# Patient Record
Sex: Male | Born: 1940 | Race: White | Hispanic: No | Marital: Married | Smoking: Never smoker
Health system: Southern US, Community
[De-identification: ages and names within clinical notes are randomized; demographics above are authoritative.]

## PROBLEM LIST (undated history)

## (undated) DIAGNOSIS — I2510 Atherosclerotic heart disease of native coronary artery without angina pectoris: Secondary | ICD-10-CM

## (undated) HISTORY — PX: CARDIAC SURGERY: SHX584

---

## 2013-01-03 LAB — COMPREHENSIVE METABOLIC PANEL
Albumin: 3.9 g/dL (ref 3.4–5.0)
Anion Gap: 8 (ref 7–16)
Bilirubin,Total: 0.5 mg/dL (ref 0.2–1.0)
Calcium, Total: 8.8 mg/dL (ref 8.5–10.1)
Chloride: 107 mmol/L (ref 98–107)
Co2: 25 mmol/L (ref 21–32)
EGFR (Non-African Amer.): >60
Glucose: 129 mg/dL — ABNORMAL HIGH (ref 65–99)
Osmolality: 283 (ref 275–301)
Potassium: 3.8 mmol/L (ref 3.5–5.1)
SGOT(AST): 21 U/L (ref 15–37)
Sodium: 140 mmol/L (ref 136–145)
Total Protein: 7.1 g/dL (ref 6.4–8.2)

## 2013-01-03 LAB — TROPONIN I: Troponin-I: 0.03 ng/mL

## 2013-01-03 LAB — CBC
HCT: 39.4 % — ABNORMAL LOW (ref 40.0–52.0)
HGB: 13.4 g/dL (ref 13.0–18.0)
MCH: 31.7 pg (ref 26.0–34.0)
MCHC: 34.1 g/dL (ref 32.0–36.0)
Platelet: 233 10*3/uL (ref 150–440)
RDW: 13 % (ref 11.5–14.5)
WBC: 7.3 10*3/uL (ref 3.8–10.6)

## 2013-01-03 LAB — CK TOTAL AND CKMB (NOT AT ARMC)
CK, Total: 64 U/L (ref 35–232)
CK-MB: 1.6 ng/mL (ref 0.5–3.6)

## 2013-01-03 LAB — MAGNESIUM: Magnesium: 1.7 mg/dL — ABNORMAL LOW

## 2013-01-04 ENCOUNTER — Inpatient Hospital Stay: Payer: Self-pay | Admitting: Internal Medicine

## 2013-01-04 LAB — CK TOTAL AND CKMB (NOT AT ARMC)
CK, Total: 76 U/L (ref 35–232)
CK, Total: 102 U/L (ref 35–232)

## 2013-01-04 LAB — TROPONIN I
Troponin-I: 0.13 ng/mL — ABNORMAL HIGH
Troponin-I: 0.08 ng/mL — ABNORMAL HIGH

## 2013-01-04 LAB — MAGNESIUM: Magnesium: 2.3 mg/dL

## 2013-01-05 LAB — BASIC METABOLIC PANEL
Anion Gap: 5 — ABNORMAL LOW (ref 7–16)
BUN: 12 mg/dL (ref 7–18)
Calcium, Total: 8.5 mg/dL (ref 8.5–10.1)
Chloride: 108 mmol/L — ABNORMAL HIGH (ref 98–107)
Co2: 27 mmol/L (ref 21–32)
EGFR (African American): >60
EGFR (Non-African Amer.): >60
Glucose: 111 mg/dL — ABNORMAL HIGH (ref 65–99)
Potassium: 4.4 mmol/L (ref 3.5–5.1)
Sodium: 140 mmol/L (ref 136–145)

## 2013-01-05 LAB — CBC WITH DIFFERENTIAL/PLATELET
HCT: 37.6 % — ABNORMAL LOW (ref 40.0–52.0)
Lymphocyte %: 13.5 %
MCH: 32.1 pg (ref 26.0–34.0)
Platelet: 220 10*3/uL (ref 150–440)
RBC: 4.05 10*6/uL — ABNORMAL LOW (ref 4.40–5.90)

## 2013-01-05 LAB — LIPID PANEL
Cholesterol: 207 mg/dL — ABNORMAL HIGH (ref 0–200)
HDL Cholesterol: 73 mg/dL — ABNORMAL HIGH (ref 40–60)
Ldl Cholesterol, Calc: 119 mg/dL — ABNORMAL HIGH (ref 0–100)
Triglycerides: 73 mg/dL (ref 0–200)
VLDL Cholesterol, Calc: 15 mg/dL (ref 5–40)

## 2014-10-11 NOTE — H&P (Signed)
PATIENT NAME:  Garrett Travis, [name] MR#:  [id number] DATE OF BIRTH:  [birth year]  DATE OF ADMISSION:  01/04/2013  REFERRING PHYSICIAN:  Dr. Bayard Malesandolph Brown   PRIMARY CARE PHYSICIAN:  Following with primary care physician at [facility] in [place].   CHIEF COMPLAINT:  Syncope.   HISTORY OF PRESENT ILLNESS:  This is a 74 year old male without significant past medical history who presents with syncope. The patient was in church where he had syncope. The patient denies any symptoms of prior to syncope.  No dizziness.  No chest pain.  No shortness of breath.  No lightheadedness. The patient had syncope for 1 minute and then he recovered consciousness without any confusion or altered mental status. There was no seizure-like activity and no urinary or stool incontinence. Denies any head trauma. When EMS presented, the patient was found to be in A-Fib with RVR, he was given IV Cardizem 10 mg IV x 2 en route without resolution of his A-Fib with RVR, so he was started on amiodarone drip in the Emergency Room Department where he converted to normal sinus. The patient's initial EKG showing A-Fib with RVR.  Repeat EKG after he converted to normal sinus rhythm did show ST inversion in the anterior lateral leads. Unfortunately, there is no old EKG to compare. The patient's basic labs were sent.  They were only significant for some mild hypomagnesemia with magnesium at 1.7. Hospitalist service was requested to admit the patient for further management and work-up for his new onset A-Fib and syncope.   PAST MEDICAL HISTORY:  None.   PAST SURGICAL HISTORY:  None.   ALLERGIES:  No known drug allergies.    HOME MEDICATIONS:  Vitamin C 500 mg oral daily.   SOCIAL HISTORY:  He is retired, used to work in [employer].  Denies any history of smoking or alcohol use.   FAMILY HISTORY:  Significant for cardiac disease, but at old age in his father.   REVIEW OF SYSTEMS:  CONSTITUTIONAL:  Denies fever, chills, fatigue,  weakness.  EYES:  Denies blurry vision, double vision, inflammation, glaucoma.  EARS, NOSE, THROAT:  Denies tinnitus, ear pain, epistaxis or discharge.  RESPIRATORY:  Denies cough, wheezing, hemoptysis, COPD.  CARDIOVASCULAR:  Denies any chest pain, orthopnea, edema, arrhythmia, palpitations.  Has had episode of syncope.  GASTROINTESTINAL:  Denies nausea, vomiting, diarrhea, abdominal pain, hematemesis, rectal bleed, jaundice.  GENITOURINARY:  Denies dysuria, hematuria, renal colic.  ENDOCRINE:  Denies polyuria, polydipsia, heat or cold intolerance.  HEMATOLOGY:  Denies anemia, easy bruising, bleeding diathesis.  INTEGUMENTARY:  Denies acne, rash or skin lesions.  MUSCULOSKELETAL:  Denies any neck pain, shoulder pain, arthritis, cramps, gout or limited activity.  NEUROLOGIC:  Denies CVA, TIA, ataxia, dementia, headache, epilepsy, weakness, tremors, vertigo.  Had episode of syncope.  PSYCHIATRIC:  Denies anxiety, schizophrenia, nervousness, bipolar disorder.   PHYSICAL EXAMINATION: VITAL SIGNS:  Temperature 97.3, pulse 78, respiratory rate 28, blood pressure 112/67, saturating 96% on room air.  GENERAL:  Well-nourished male who is comfortable in bed in no apparent distress.  HEENT:  Head atraumatic, normocephalic.  Pupils equal, reactive to light.  Pink conjunctivae.  Anicteric sclerae.  Moist oral mucosa.  NECK:  Supple.  No thyromegaly.  No JVD.  CHEST:  Good air entry bilaterally.  No wheezing, rales or rhonchi.  CARDIOVASCULAR:  S1, S2 heard.  No rubs, murmurs, gallops.  ABDOMEN:  Soft, nontender, nondistended.  Bowel sounds present.  EXTREMITIES:  No edema.  No clubbing.  No cyanosis.  Dorsalis  pedis pulse +2 bilaterally.  PSYCHIATRIC:  Appropriate affect.  Awake, alert x 3.  Intact judgment and insight.  NEUROLOGIC:  Cranial nerves grossly intact.  Motor 5 out of 5.  No focal deficits.  LYMPHATIC:  No cervical or supraclavicular lymphadenopathy.  SKIN:  Normal skin turgor.  Warm and  dry.   PERTINENT LABORATORY DATA:  Glucose 129, BUN 19, creatinine 0.98, sodium 140, potassium 3.8, chloride 107, magnesium 1.7.  Troponin 0.03, ALT 16, AST 21,  total bili 0.5, white blood cells 7.3, hemoglobin 13.4, hematocrit 39.4, platelets 233.    EKG showing normal sinus rhythm with T wave inversion in the anterior lateral leads.  Initial EKG upon presentation did show atrial fibrillation with RVR at 121 beats per minute.   ASSESSMENT AND PLAN: 1.  Syncope, this is most likely related to his new onset atrial fibrillation with rapid ventricular rate, so the patient will be admitted to telemetry unit.  We will continue to cycle his cardiac enzymes, and he will be monitored on telemetry. We will check 2-D echocardiogram in the a.m. and we will start the patient on gentle hydration.  2.  New onset atrial fibrillation, appears to be currently resolved, currently is normal sinus rhythm.  We will discontinue amiodarone drip and we will start him on low-dose metoprolol 25 mg by mouth twice daily. The patient's CHADS2 score is 1 given his age.  Denies any history of hypertension, diabetes or congestive heart failure.  We will start him on aspirin 81 mg daily.  As well, we will consult cardiology regarding further recommendation for anticoagulation if anything besides aspirin is needed, as well as to evaluate for his abnormal EKG given his T wave inversion in the anterior lateral leads.  3.  Hypomagnesemia.  We will replace.  4.  DVT prophylaxis.  SubQ heparin.  5.  CODE STATUS:  FULL CODE.   Total time spent on admission and patient care 55 minutes.    ____________________________ Starleen Arms, MD dse:ea D: 01/04/2013 01:27:05 ET T: 01/04/2013 02:19:35 ET JOB#: [id number]  cc: Starleen Arms, MD, <Dictator> Edith Groleau Teena Irani MD ELECTRONICALLY SIGNED 01/04/2013 4:59

## 2014-10-11 NOTE — Discharge Summary (Signed)
PATIENT NAME:  Garrett Travis, Garrett Travis MR#:  [id number] DATE OF BIRTH:  02/28/1941  TRANSFER SUMMARY/DISCHARGE SUMMARY  DATE OF ADMISSION:  01/04/2013 DATE OF DISCHARGE/TRANSFER:  01/05/2013  ACCEPTING FACILITY AND PHYSICIAN:  [facility], Dr. Kennon RoundsHaney.  DISCHARGE DIAGNOSIS:  Multivessel coronary artery disease, status post cardiac catheter.   SECONDARY DIAGNOSES:  1.  Syncope, likely due to underlying coronary artery disease.  2.  New onset atrial fibrillation.  3.  Hypomagnesemia, repleted and resolved.   CONSULTATION:  Cardiology, Dr. Gwen PoundsKowalski.   PROCEDURES/RADIOLOGY:  Cardiac cath by Dr. Gwen PoundsKowalski on July 18th showed critical ostial LAD and RCA stenosis, more than 95%, with severe LV dysfunction with EF of 20%. A 2-D echocardiogram on July 17th showed severe LV dysfunction with an EF less than 20%. Moderate to severely increased LV internal cavity size. Severely dilated left atrium. Moderately dilated right atrium. Moderate to severe mitral valve regurgitation. Moderate to severe tricuspid regurgitation. Moderately elevated pulmonary artery systolic pressure.   Chest x-ray on admission showed atelectasis at the right lung base.   CT scan of the head without contrast in the ED showed no acute intracranial abnormality.   Chest x-ray on July 17th showed findings consistent with low-grade CHF with small pleural effusion. No evidence of pneumonia.   Bilateral carotid Dopplers on July 17th showed no hemodynamically significant stenosis.   HISTORY AND SHORT HOSPITAL COURSE:  The patient is a 74 year old male with no significant medical problems who was admitted for syncope. Please see Dr. Randol KernElgergawy dictated history and physical for further details. The patient also had new onset a-fib. A Cardiology consultation was obtained with Dr. Gwen PoundsKowalski, who recommended 2-D echo, which was performed with the results dictated above. The EF was less than 20% for which cardiac cath was recommended,  which was done today, showing triple-vessel disease with critical stenosis of the LAD and RCA, for which the [facility] is [facility] for possible CABG.  TOTAL TIME TAKING CARE OF THIS PATIENT:  55 minutes.   ____________________________ Ellamae SiaVipul Travis. Sherryll BurgerShah, MD vss:jm D: 01/05/2013 16:55:59 ET T: 01/05/2013 17:22:29 ET JOB#: [id number]  cc: [name]. Sherryll BurgerShah, MD, <Dictator> [place] in [place], [place] Cart[place] Mogle Travis. Sherryll BurgerShah, MD, <Dictator> [place] care [place] in [place], [place] Dr. Kennon RoundsHaney, [facility] Lamar BlinksBruce J. Kowalski, MD Ellamae SiaVIPUL Travis [facility] MD ELECTRONICALLY SIGNED 01/08/2013 8:42

## 2014-10-11 NOTE — Consult Note (Signed)
PATIENT NAME:  Garrett Travis, Garrett Travis MR#:  [id number] DATE OF BIRTH:  [birth year]  DATE OF CONSULTATION:  01/04/2013  REFERRING PHYSICIAN: Huey Bienenstockawood Elgergawy, M.D.   CONSULTING PHYSICIAN:  Lamar BlinksBruce J. Geanie Pacifico, MD  REASON FOR CONSULTATION: Syncope with atrial fibrillation.   CHIEF COMPLAINT: "I passed out."   HISTORY OF PRESENT ILLNESS: This is a 74 year old male with no evidence of cardiovascular history, who has had a new onset of episodes of syncope while at church. He had no warning at that time and had a full syncopal episode for which he had no symptoms thereafter. He has not had any congestive heart failure type symptoms, weakness, fatigue, or chest discomfort. He has had an elevated troponin of 0.13 consistent with demand ischemia and not consistent with acute subendocardial myocardial infarction. The patient has had a telemetry showing atrial fibrillation with rapid ventricular rate, now in normal sinus rhythm with spontaneous conversion to normal sinus rhythm. The patient was placed on metoprolol and is remaining in normal sinus rhythm at this time. Currently, he has no symptoms. Carotid Doppler shows normal carotid arteries with no evidence of significant carotid atherosclerosis.   PAST MEDICAL HISTORY: None.   FAMILY HISTORY: No family members with early onset of cardiovascular disease or hypertension.   SOCIAL HISTORY: Currently denies alcohol or tobacco use.   ALLERGIES: As listed.   MEDICATIONS: As listed.   PHYSICAL EXAMINATION:  VITAL SIGNS: Blood pressure is 115/76 bilaterally, heart rate 72 upright, reclining, and regular.  GENERAL: He is a well appearing male in no acute distress.  HEENT: No icterus, thyromegaly, ulcers, hemorrhage or xanthelasma.  CARDIOVASCULAR: Irregularly irregular with a normal S1 and S2 with a 2 out of 6 apical murmur consistent with mitral regurgitation. PMI is diffuse. Carotid upstroke normal without bruit. Jugular venous pressure is normal.  LUNGS: Few  basilar crackles with normal respirations.  ABDOMEN: Soft, nontender, without hepatosplenomegaly or masses. Abdominal aorta is normal size without bruit.  EXTREMITIES: 2+ bilateral pulses in dorsal, pedal, radial, and femoral arteries without lower extremity edema, cyanosis, clubbing, or ulcers.  NEUROLOGIC: He is oriented to time, place, and person with normal mood and affect.   ASSESSMENT: This is a 74 year old male with minimal elevation of troponin most consistent with demand ischemia with an episode of syncope and atrial fibrillation, now in normal sinus rhythm on beta blocker needing further evaluation and treatment options.   RECOMMENDATIONS:  1.  Continue ambulating following for beta blocker, heart rate control and maintenance of normal rhythm.  2.  Echocardiogram for LV systolic dysfunction, valvular heart disease causing above.  3.  Carotid Doppler to assess for carotid atherosclerosis causing current symptoms.  4.  Further diagnostic testing and treatment options after above.  ____________________________ Lamar BlinksBruce J. Koren Plyler, MD bjk:aw D: 01/04/2013 12:42:39 ET T: 01/04/2013 13:28:27 ET JOB#: [id number]  cc: Lamar BlinksBruce J. Zelie Asbill, MD, <Dictator> Lamar BlinksBRUCE J Natha Guin MD ELECTRONICALLY SIGNED 01/06/2013 10:33

## 2014-12-07 IMAGING — US US CAROTID DUPLEX BILAT
1 series · 14 of 24 positions shown · non-contrast
Comparison: none

REASON FOR EXAM: syncope
COMMENTS:

[Series 1: us carotid duplex bilat · 0.10mm/px · 14 of 71 slices shown]
[im 1/71]
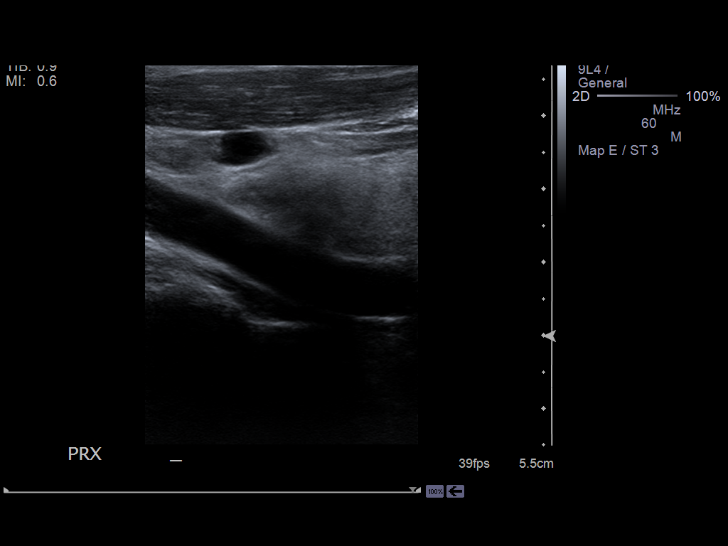
[im 7/71]
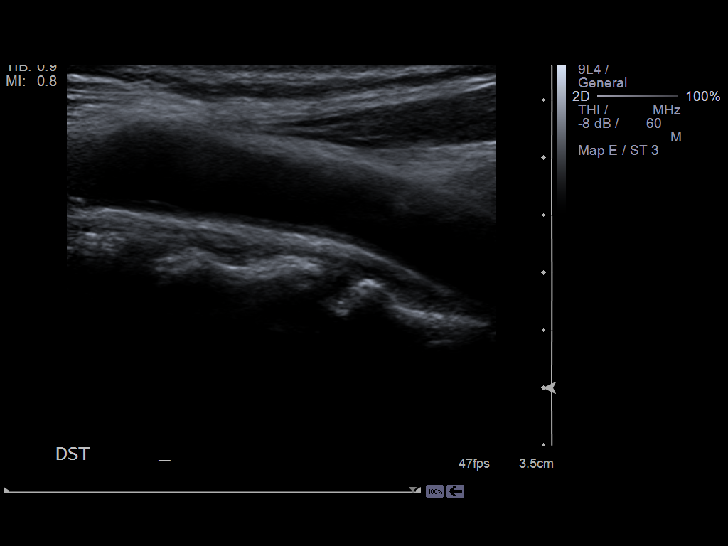
[im 13/71]
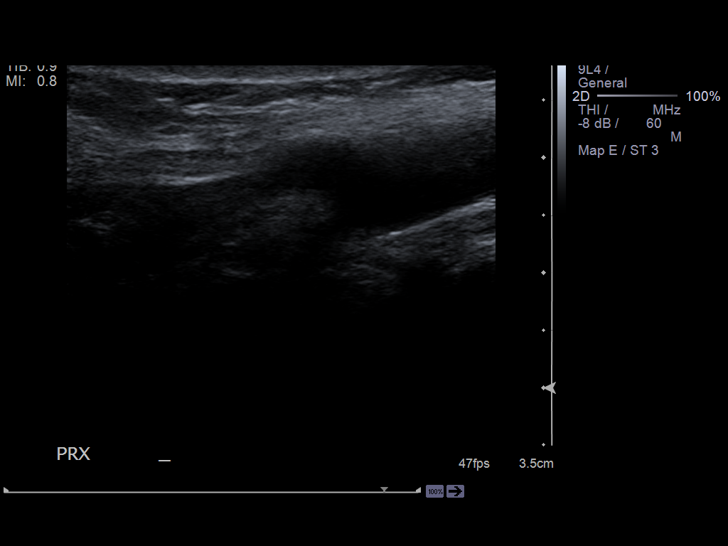
[im 19/71]
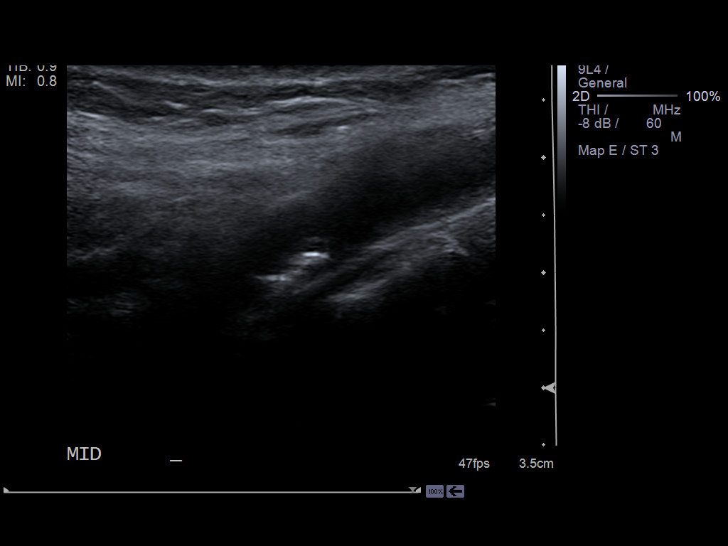
[im 22/71]
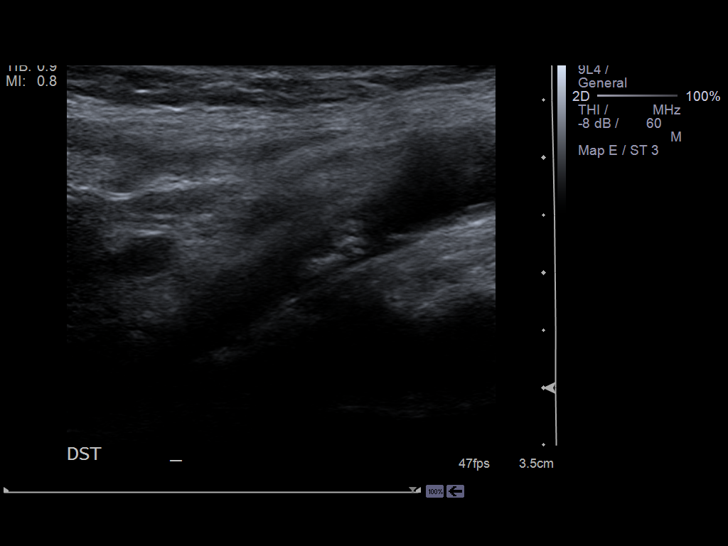
[im 28/71]
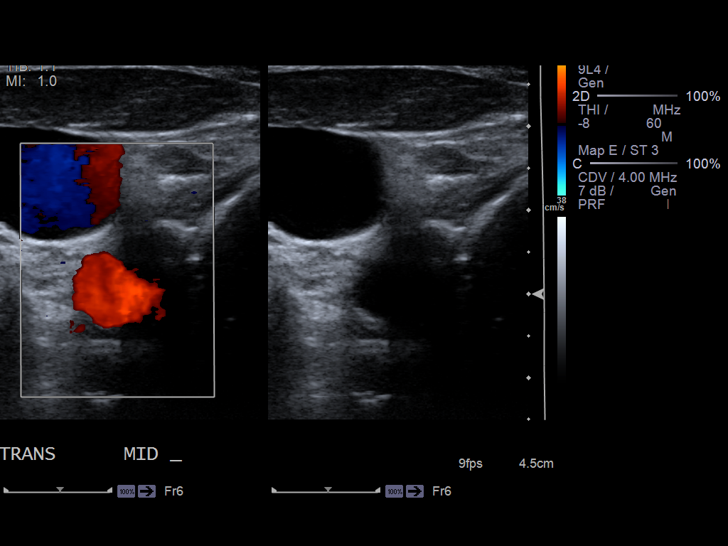
[im 34/71]
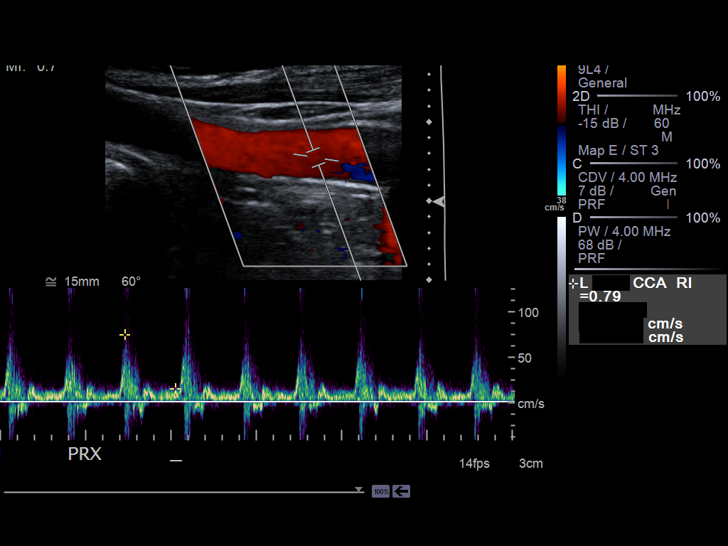
[im 37/71]
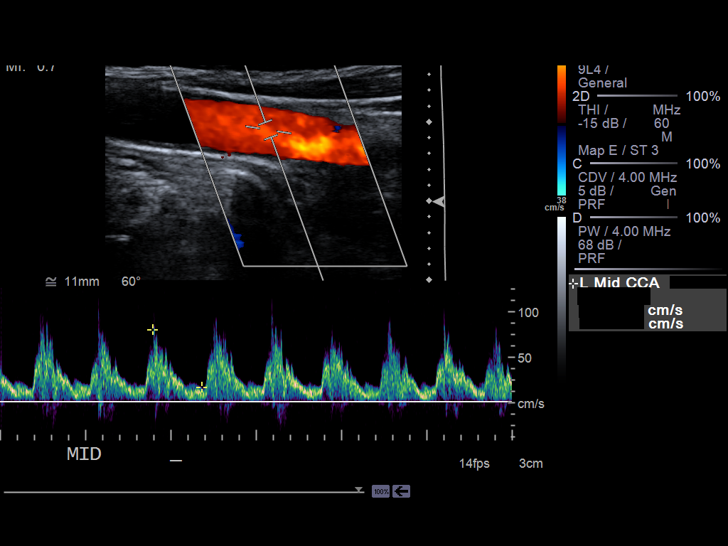
[im 43/71]
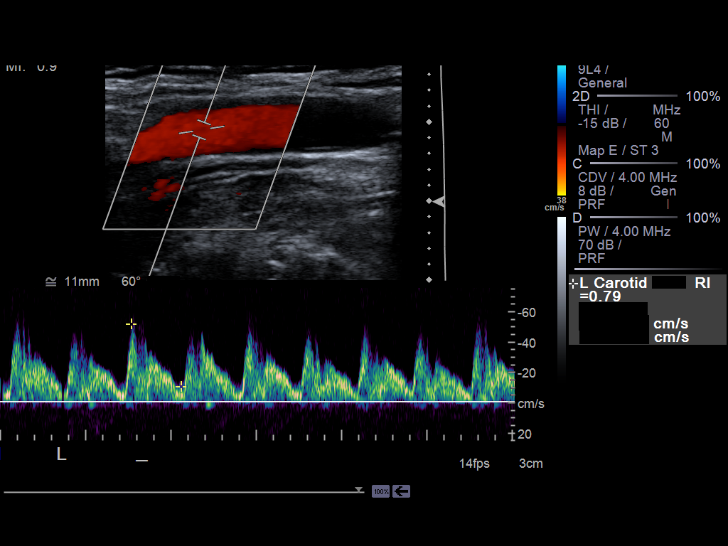
[im 49/71]
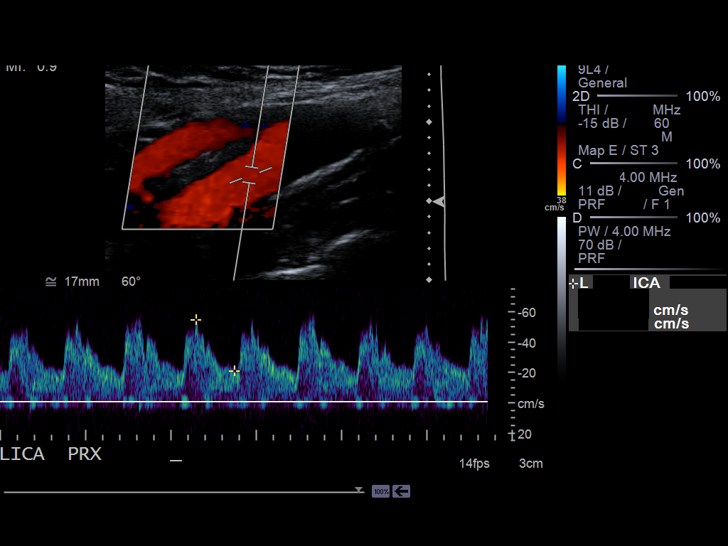
[im 55/71]
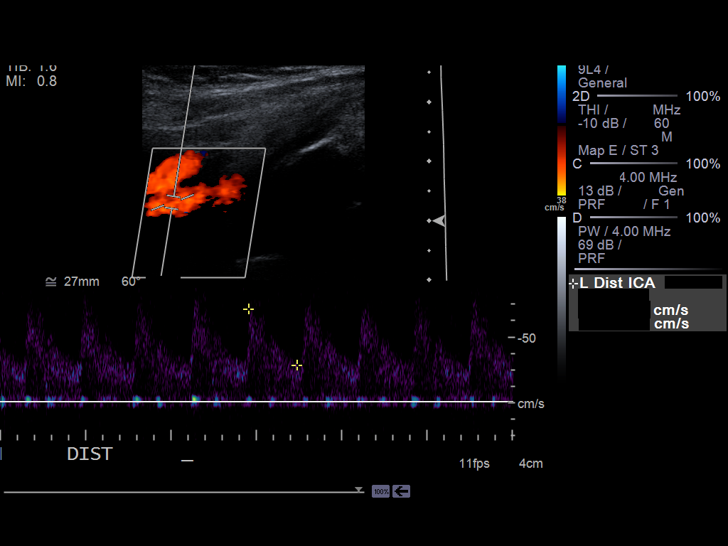
[im 58/71]
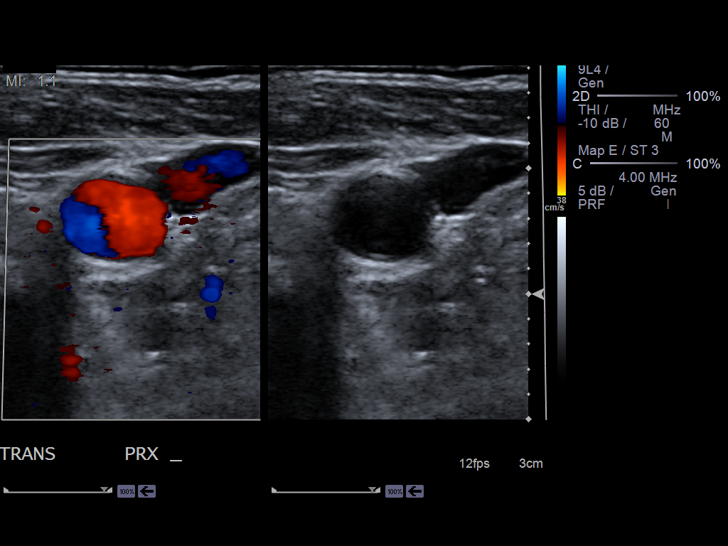
[im 64/71]
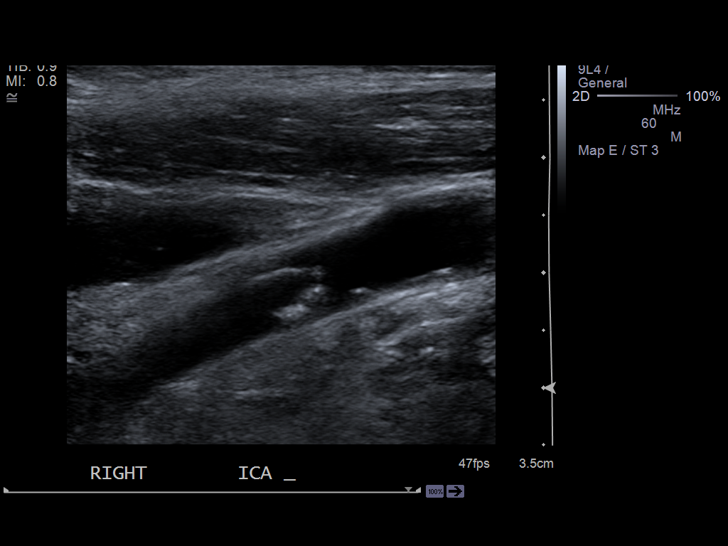
[im 71/71]
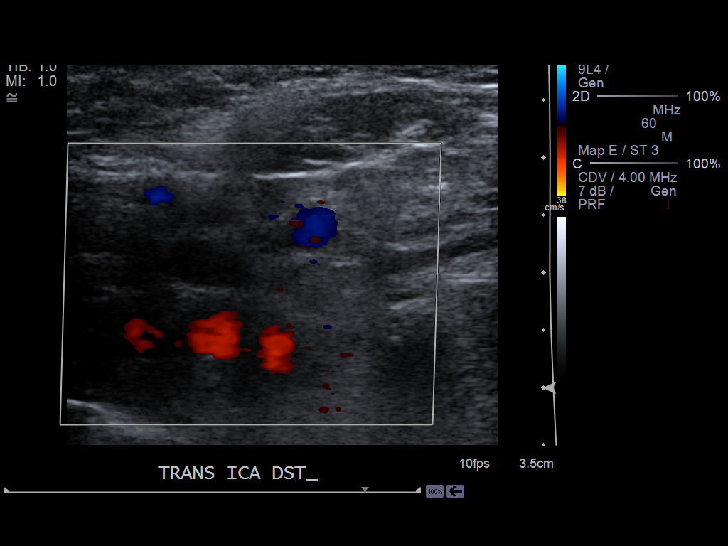

[14 of 24 positions shown; findings below may reference images not displayed]

PROCEDURE:     US  - US CAROTID DOPPLER BILATERAL  - January 04, 2013  [DATE]

RESULT:     Grayscale and color flow Doppler techniques were employed to
evaluate the cervical carotid arteries.

On the right there is a small amount of calcified plaque in the proximal ICA
with a small amount of soft plaque at the bulb. The waveform patterns and
color flow images do not suggest significant stenotic lesions. On the right
the peak internal carotid systolic velocity measured 95 cm/sec and the peak
common carotid velocity measured 84 cm/sec corresponding to a normal ratio
of 1.1.

On the left there is a small amount of calcified plaque in the distal CCA
and a small amount of soft plaque at the carotid bulb. No plaque is
demonstrated in the proximal ICA. The peak internal carotid systolic
velocity measured 71 cm/sec and the peak common carotid velocity measured 80
cm/sec corresponding to a normal ratio of 0.9.

The vertebral arteries are normal in flow direction bilaterally.
IMPRESSION: There is no evidence of a hemodynamically significant
carotid stenosis.

[REDACTED]

## 2018-10-18 ENCOUNTER — Encounter: Payer: Self-pay | Admitting: Emergency Medicine

## 2018-10-18 ENCOUNTER — Other Ambulatory Visit: Payer: Self-pay

## 2018-10-18 ENCOUNTER — Emergency Department
Admission: EM | Admit: 2018-10-18 | Discharge: 2018-10-18 | Disposition: A | Discharge status: Other Acute Inpatient Hospital | Payer: Medicare PPO | Attending: Emergency Medicine | Admitting: Emergency Medicine

## 2018-10-18 ENCOUNTER — Emergency Department: Payer: Medicare PPO

## 2018-10-18 DIAGNOSIS — Z79899 Other long term (current) drug therapy: Secondary | ICD-10-CM | POA: Diagnosis not present

## 2018-10-18 DIAGNOSIS — Y929 Unspecified place or not applicable: Secondary | ICD-10-CM | POA: Diagnosis not present

## 2018-10-18 DIAGNOSIS — Z7901 Long term (current) use of anticoagulants: Secondary | ICD-10-CM | POA: Insufficient documentation

## 2018-10-18 DIAGNOSIS — S61224A Laceration with foreign body of right ring finger without damage to nail, initial encounter: Secondary | ICD-10-CM | POA: Diagnosis not present

## 2018-10-18 DIAGNOSIS — S62652B Nondisplaced fracture of medial phalanx of right middle finger, initial encounter for open fracture: Secondary | ICD-10-CM

## 2018-10-18 DIAGNOSIS — I2510 Atherosclerotic heart disease of native coronary artery without angina pectoris: Secondary | ICD-10-CM | POA: Insufficient documentation

## 2018-10-18 DIAGNOSIS — Y998 Other external cause status: Secondary | ICD-10-CM | POA: Insufficient documentation

## 2018-10-18 DIAGNOSIS — W312XXA Contact with powered woodworking and forming machines, initial encounter: Secondary | ICD-10-CM | POA: Insufficient documentation

## 2018-10-18 DIAGNOSIS — S62652A Nondisplaced fracture of medial phalanx of right middle finger, initial encounter for closed fracture: Secondary | ICD-10-CM | POA: Diagnosis not present

## 2018-10-18 DIAGNOSIS — S61214A Laceration without foreign body of right ring finger without damage to nail, initial encounter: Secondary | ICD-10-CM

## 2018-10-18 DIAGNOSIS — Y999 Unspecified external cause status: Secondary | ICD-10-CM | POA: Insufficient documentation

## 2018-10-18 DIAGNOSIS — S61210A Laceration without foreign body of right index finger without damage to nail, initial encounter: Secondary | ICD-10-CM | POA: Insufficient documentation

## 2018-10-18 DIAGNOSIS — S68119A Complete traumatic metacarpophalangeal amputation of unspecified finger, initial encounter: Secondary | ICD-10-CM

## 2018-10-18 DIAGNOSIS — S68129A Partial traumatic metacarpophalangeal amputation of unspecified finger, initial encounter: Secondary | ICD-10-CM

## 2018-10-18 DIAGNOSIS — S6991XA Unspecified injury of right wrist, hand and finger(s), initial encounter: Secondary | ICD-10-CM | POA: Diagnosis present

## 2018-10-18 DIAGNOSIS — Y939 Activity, unspecified: Secondary | ICD-10-CM | POA: Insufficient documentation

## 2018-10-18 HISTORY — DX: Atherosclerotic heart disease of native coronary artery without angina pectoris: I25.10

## 2018-10-18 LAB — CBC WITH DIFFERENTIAL/PLATELET
Abs Immature Granulocytes: 0.02 10*3/uL (ref 0.00–0.07)
Basophils Absolute: 0.1 10*3/uL (ref 0.0–0.1)
Basophils Relative: 1 %
Eosinophils Absolute: 0.3 10*3/uL (ref 0.0–0.5)
Eosinophils Relative: 5 %
HCT: 39.4 % (ref 39.0–52.0)
Hemoglobin: 12.6 g/dL — ABNORMAL LOW (ref 13.0–17.0)
Immature Granulocytes: 0 %
Lymphocytes Relative: 17 %
Lymphs Abs: 1.1 10*3/uL (ref 0.7–4.0)
MCH: 32.7 pg (ref 26.0–34.0)
MCHC: 32 g/dL (ref 30.0–36.0)
MCV: 102.3 fL — ABNORMAL HIGH (ref 80.0–100.0)
Monocytes Absolute: 0.8 10*3/uL (ref 0.1–1.0)
Monocytes Relative: 13 %
Neutro Abs: 4.1 10*3/uL (ref 1.7–7.7)
Neutrophils Relative %: 64 %
Platelets: 267 10*3/uL (ref 150–400)
RBC: 3.85 MIL/uL — ABNORMAL LOW (ref 4.22–5.81)
RDW: 12.9 % (ref 11.5–15.5)
WBC: 6.5 10*3/uL (ref 4.0–10.5)
nRBC: 0 % (ref 0.0–0.2)

## 2018-10-18 LAB — COMPREHENSIVE METABOLIC PANEL
ALT: 19 U/L (ref 0–44)
AST: 26 U/L (ref 15–41)
Albumin: 4.2 g/dL (ref 3.5–5.0)
Alkaline Phosphatase: 91 U/L (ref 38–126)
Anion gap: 11 (ref 5–15)
BUN: 21 mg/dL (ref 8–23)
CO2: 23 mmol/L (ref 22–32)
Calcium: 8.9 mg/dL (ref 8.9–10.3)
Chloride: 105 mmol/L (ref 98–111)
Creatinine, Ser: 1.04 mg/dL (ref 0.61–1.24)
GFR calc Af Amer: >60 mL/min (ref >60)
GFR calc non Af Amer: >60 mL/min (ref >60)
Glucose, Bld: 122 mg/dL — ABNORMAL HIGH (ref 70–99)
Potassium: 4.3 mmol/L (ref 3.5–5.1)
Sodium: 139 mmol/L (ref 135–145)
Total Bilirubin: 1.1 mg/dL (ref 0.3–1.2)
Total Protein: 7.3 g/dL (ref 6.5–8.1)

## 2018-10-18 LAB — PROTIME-INR
INR: 1.5 — ABNORMAL HIGH (ref 0.8–1.2)
Prothrombin Time: 17.9 seconds — ABNORMAL HIGH (ref 11.4–15.2)

## 2018-10-18 MED ORDER — TRANEXAMIC ACID 1000 MG/10ML IV SOLN: 1000.0000 mg | Freq: Once | INTRAVENOUS | Status: DC

## 2018-10-18 MED ORDER — SODIUM CHLORIDE 0.9 % IV BOLUS
1000.0000 mL | Freq: Once | INTRAVENOUS | Status: AC
  Administered 2018-10-18: 1000 mL via INTRAVENOUS

## 2018-10-18 MED ORDER — TRANEXAMIC ACID-NACL 1000-0.7 MG/100ML-% IV SOLN
1000.0000 mg | INTRAVENOUS | Status: AC
  Administered 2018-10-18: 1000 mg via INTRAVENOUS
  Filled 2018-10-18: qty 100

## 2018-10-18 MED ORDER — TRANEXAMIC ACID 1000 MG/10ML IV SOLN
1000.0000 mg | Freq: Once | INTRAVENOUS | Status: AC
  Administered 2018-10-18: 1000 mg via TOPICAL
  Filled 2018-10-18: qty 10

## 2018-10-18 MED ORDER — FENTANYL CITRATE (PF) 100 MCG/2ML IJ SOLN
100.0000 ug | Freq: Once | INTRAMUSCULAR | Status: AC
  Administered 2018-10-18: 100 ug via INTRAVENOUS
  Filled 2018-10-18: qty 2

## 2018-10-18 MED ORDER — ONDANSETRON HCL 4 MG/2ML IJ SOLN
4.0000 mg | Freq: Once | INTRAMUSCULAR | Status: AC
  Administered 2018-10-18: 16:00:00 4 mg via INTRAVENOUS
  Filled 2018-10-18: qty 2

## 2018-10-18 NOTE — ED Notes (Signed)
Pt cut right hand with a table saw today.  Bleeding controlled.  Pt has lacerations to right 2nd, 3rd, 4th and 5th fingers.  Pt has good distal pulses.  Iv started and labs sent.  Pa-c in with pt on arrival to treatment room .  Pt alert

## 2018-10-18 NOTE — ED Notes (Signed)
EMTALA reviewed. 

## 2018-10-18 NOTE — ED Notes (Signed)
Family, pa-c in with pt  Pt waiting on a transfer to [facility].   Pt alert. Bleeding controlled.  Iv infusing.

## 2018-10-18 NOTE — ED Notes (Signed)
txa given by cuthriell pa-c.  Iv fluids infusing.  Pt alert.

## 2018-10-18 NOTE — ED Notes (Addendum)
[facility] ground crew in with pt now.  Pt alert.  Bleeding controlled on right hand.  Iv fluids infusing.

## 2018-10-18 NOTE — ED Notes (Signed)
Transfer consent signed by wife.

## 2018-10-18 NOTE — ED Provider Notes (Signed)
[facility] Emergency Department Provider Note  ____________________________________________  Time seen: Approximately 3:25 PM  I have reviewed the triage vital signs and the nursing notes.   HISTORY  Chief Complaint Laceration    HPI Garrett S Eda Keyserry Jr. is a 78 y.o. male who presents the emergency department for hand laceration to the right hand.  Patient was using a table saw, attempting to cut a piece of wood when his wife said something to him.  Patient reports that he was distracted, looked up and ran his hand into the table saw.  Patient has no range of motion to the third digit of the right hand and has lacerations to the index, middle, ring and little finger.  Patient is on blood thinners and has not been able to stop the bleeding.  Patient denies any other injury or complaint.  Patient's tetanus shot is up-to-date at this time.           Past Medical History:  Diagnosis Date   Coronary artery disease     There are no active problems to display for this patient.   Past Surgical History:  Procedure Laterality Date   CARDIAC SURGERY      Prior to Admission medications   Medication Sig Start Date End Date Taking? Authorizing Provider  atorvastatin (LIPITOR) 20 MG tablet Take 20 mg by mouth daily.   Yes [provider]  metoprolol succinate (TOPROL-XL) 25 MG 24 hr tablet Take 25 mg by mouth 2 (two) times daily.   Yes [provider]  docusate sodium (COLACE) 100 MG capsule Take 100 mg by mouth 2 (two) times daily.    [provider]    Allergies Patient has no known allergies.  No family history on file.  Social History Social History   Tobacco Use   Smoking status: Never Smoker   Smokeless tobacco: Never Used  Substance Use Topics   Alcohol use: Not Currently   Drug use: Not on file     Review of Systems  Constitutional: No fever/chills Eyes: No visual changes. No discharge ENT: No upper  respiratory complaints. Cardiovascular: no chest pain. Respiratory: no cough. No SOB. Gastrointestinal: No abdominal pain.  No nausea, no vomiting.  Musculoskeletal: Tablesaw injury to the right hand Skin: Negative for rash, abrasions, lacerations, ecchymosis. Neurological: Negative for headaches, focal weakness or numbness. 10-point ROS otherwise negative.  ____________________________________________   PHYSICAL EXAM:  VITAL SIGNS: ED Triage Vitals  Enc Vitals Group     BP 10/18/18 1505 (!) 144/80     Pulse Rate 10/18/18 1505 83     Resp --      Temp 10/18/18 1505 (!) 97.5 F (36.4 C)     Temp Source 10/18/18 1505 Oral     SpO2 10/18/18 1505 94 %     Weight --      Height --      Head Circumference --      Peak Flow --      Pain Score 10/18/18 1502 9     Pain Loc --      Pain Edu? --      Excl. in GC? --      Constitutional: Alert and oriented. Well appearing and in no acute distress. Eyes: Conjunctivae are normal. PERRL. EOMI. Head: Atraumatic. Neck: No stridor.    Cardiovascular: Normal rate, regular rhythm. Normal S1 and S2.  Good peripheral circulation. Respiratory: Normal respiratory effort without tachypnea or retractions. Lungs CTAB. Good air entry to  the bases with no decreased or absent breath sounds. Musculoskeletal: Full range of motion to all extremities. No gross deformities appreciated.  Visualization of the right hand reveals multiple ragged lacerations.  Patient has lacerations to the index, middle, ring and little digit.  Patient has heavy bleeding from multiple lacerations with most significant bleeding occurring from the third digit.  This is pulsatile in nature.  Distal finger of the middle finger is blue in color.  Patient has no extension or flexion of this digit at this time.  Patient has no sensation to this digit either.  Patient does have sensation and range of motion to the second, fourth and fifth digits.  Capillary refill intact in these digits.   See below pictures for laceration placements. Neurologic:  Normal speech and language. No gross focal neurologic deficits are appreciated.  Skin:  Skin is warm, dry and intact. No rash noted. Psychiatric: Mood and affect are normal. Speech and behavior are normal. Patient exhibits appropriate insight and judgement.       ____________________________________________   LABS (all labs ordered are listed, but only abnormal results are displayed)  Labs Reviewed  COMPREHENSIVE METABOLIC PANEL - Abnormal; Notable for the following components:      Result Value   Glucose, Bld 122 (*)    All other components within normal limits  CBC WITH DIFFERENTIAL/PLATELET - Abnormal; Notable for the following components:   RBC 3.85 (*)    Hemoglobin 12.6 (*)    MCV 102.3 (*)    All other components within normal limits  PROTIME-INR - Abnormal; Notable for the following components:   Prothrombin Time 17.9 (*)    INR 1.5 (*)    All other components within normal limits  TYPE AND SCREEN   ____________________________________________  EKG   ____________________________________________  RADIOLOGY I personally viewed and evaluated these images as part of my medical decision making, as well as reviewing the written report by the radiologist.  On x-ray, gross soft tissue injury identified to the index, middle, ring and pinky fingers.  Patient has significant bony loss over the middle phalanx of the middle finger.  Patient has amputated through the distal tuft of the pinky finger.  Dg Hand Complete Right  Result Date: 10/18/2018 CLINICAL DATA:  78 year old male with table saw injury EXAM: RIGHT HAND - COMPLETE 3+ VIEW COMPARISON:  None. FINDINGS: Soft tissue defect and acute fracture/bony defect of the middle phalanx of the third digit of the right hand. The fracture line/defect extends to the PIP. Displaced bony fragment within the dorsal soft tissues of the third digit. Acute soft tissue defect and  bony abnormality of the distal phalanx of the fifth digit with displacement of the distal fracture fragment/defect. Radiopaque foreign bodies within the soft tissues on the dorsal aspect of the 4/5 metacarpal heads. Degenerative changes of the interphalangeal joints. Degenerative changes of the first interphalangeal joint and carpometacarpal joint. IMPRESSION: Acute penetrating injury with soft tissue defect and acute bony defect involving the base of the middle phalanx of the third digit, as well as the distal phalanx of the fifth digit, with displacement of bone fragments. Electronically Signed   By: Gilmer Mor D.O.   On: 10/18/2018 15:49    ____________________________________________    PROCEDURES  Procedure(s) performed:    .Critical Care Performed by: Racheal Patches, PA-C Authorized by: Racheal Patches, PA-C   Critical care provider statement:    Critical care time (minutes):  45   Critical care time was exclusive  of:  Separately billable procedures and treating other patients and teaching time   Critical care was necessary to treat or prevent imminent or life-threatening deterioration of the following conditions:  Trauma   Critical care was time spent personally by me on the following activities:  Development of treatment plan with patient or surrogate, discussions with consultants, evaluation of patient's response to treatment, examination of patient, ordering and performing treatments and interventions, ordering and review of laboratory studies and ordering and review of radiographic studies   I assumed direction of critical care for this patient from another provider in my specialty: no   Comments:     Patient sustained significant injury to the right hand from a table saw.  Patient had arterial bleeding, near amputation of 2 digits.      Medications  tranexamic acid (CYKLOKAPRON) injection 1,000 mg (1,000 mg Topical Given 10/18/18 1555)  sodium chloride 0.9 %  bolus 1,000 mL (0 mLs Intravenous Stopped 10/18/18 1650)  fentaNYL (SUBLIMAZE) injection 100 mcg (100 mcg Intravenous Given 10/18/18 1620)  ondansetron (ZOFRAN) injection 4 mg (4 mg Intravenous Given 10/18/18 1620)     ____________________________________________   INITIAL IMPRESSION / ASSESSMENT AND PLAN / ED COURSE  Pertinent labs & imaging results that were available during my care of the patient were reviewed by me and considered in my medical decision making (see chart for details).  Review of the [place] CSRS was performed in accordance of the [facility] prior to dispensing any controlled drugs.  Clinical Course as of Oct 17 1704  Wed Oct 18, 2018  1607 Discussed the case with on-call orthopedic surgeon, Dr. Allena Katz.  After discussion, Allena Katz advises that they are unable to manage patient and that he will require transfer.  [facility] is contacted at this time for transfer.   [JC]  1608 Patient presented to the emergency department with significant lacerations to the index, middle, fourth and ring finger.  On exam, and.  That patient had nearly amputated both the distal aspect of the fifth finger as well as the third.  Patient's third finger was dusky, cool to the touch, no range of motion, significantly delayed capillary refill.  X-ray reveals almost complete amputation through the middle phalanx of the third finger as well as amputation of the distal tuft of the fifth.  Patient has had significant bleeding from the third digit.  This has been arterial in nature with spurring blood.  Initially, restricting band at the wrist seem to improve bleeding with direct pressure, however patient developed worsening bleeding.  TXA is applied topically as well as a finger tourniquet applied at this time.  This has drastically reduced patient's arterial bleeding.   [JC]    Clinical Course User Index [JC] Jenilyn Magana, Delorise Royals, PA-C          Patient's diagnosis is consistent with an injury with lacerations to the  second, third, fourth, fifth digits.  Patient presented to the emergency department after injury from a table saw.  Patient had significant injuries to the third and fifth digit as well as deep lacerations to the second and fourth.  Patient did have arterial bleeding to the third finger as well.  X-ray reveals amputation of the distal tuft of the fifth finger as well as near amputation through the middle phalanx of the third digit.  Patient initially had significant bleeding primarily from the middle finger.  Initial constricting band and direct pressure seemed to improve bleeding somewhat, however patient then developed further arterial, pulsatile sprain  bleeding.  At that time, TXA was applied and turnicot applied to the digit.  This has controlled bleeding to a slow ooze.  Discussed the case with on-call orthopedic surgeon who advises that patient will need transfer for hand surgery.  [facility] is contacted for transfer to the trauma service.  Trauma hand surgeon, Dr.Ruch accepts the patient in transfer.  Patient is stable at this time with bleeding controlled and vital signs stable.  Patient will be transferred via [facility] critical care ambulance service.      ____________________________________________  FINAL CLINICAL IMPRESSION(S) / ED DIAGNOSES  Final diagnoses:  Laceration of right index finger without foreign body without damage to nail, initial encounter  Open nondisplaced fracture of middle phalanx of right middle finger, initial encounter  Laceration of right ring finger without foreign body without damage to nail, initial encounter  Amputation of tip of finger, initial encounter      NEW MEDICATIONS STARTED DURING THIS VISIT:  ED Discharge Orders    None          This chart was dictated using voice recognition software/Dragon. Despite best efforts to proofread, errors can occur which can change the meaning. Any change was purely unintentional.    Racheal Patches,  PA-C 10/18/18 1706    Phineas Semen, MD 10/18/18 [phone]

## 2018-10-18 NOTE — ED Notes (Signed)
Continue to wait on [facility] transfer.

## 2018-10-18 NOTE — ED Triage Notes (Signed)
Says he was using table saw and accidentally cut fingers on right hand.  Bleeding is controlled right now.  Multiple jagged lacerations.

## 2018-10-18 NOTE — ED Notes (Signed)
Dr Derrill Kay in with pt and family.  Pt alert  Iv in place

## 2018-10-18 NOTE — ED Notes (Signed)
Iv pain meds given.  Pt alert.

## 2018-10-18 NOTE — ED Notes (Signed)
White stickers applied to all labs.  Spoke with lamont in lab and he is  Aware unable to make yellow stickers for lab specimen.

## 2018-10-18 NOTE — ED Notes (Signed)
Pt alert and waiting on transport to [facility].  Report called to [facility] vicki sutter rn.

## 2018-10-19 LAB — TYPE AND SCREEN
ABO/RH(D): O NEG
Antibody Screen: NEGATIVE

## 2020-09-19 IMAGING — DX RIGHT HAND - COMPLETE 3+ VIEW
3 series · 3 of 3 positions shown · non-contrast
Comparison: None.

CLINICAL DATA: 77-year-old male with table saw injury

EXAM:
RIGHT HAND - COMPLETE 3+ VIEW

[hand ap]
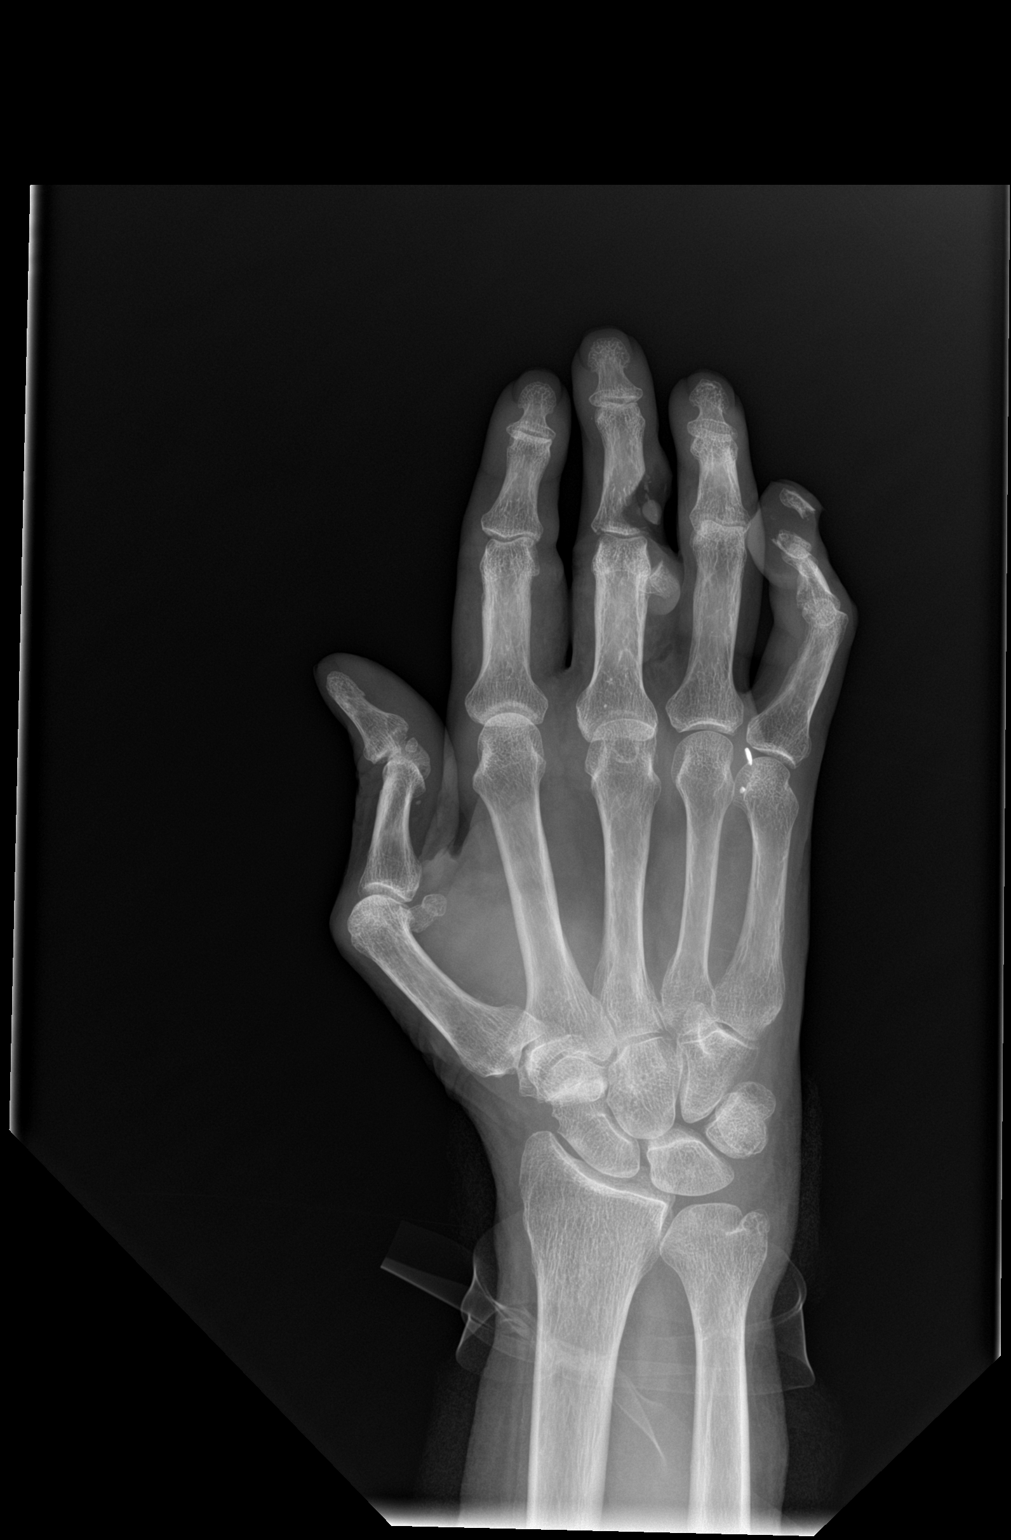

[hand obl]
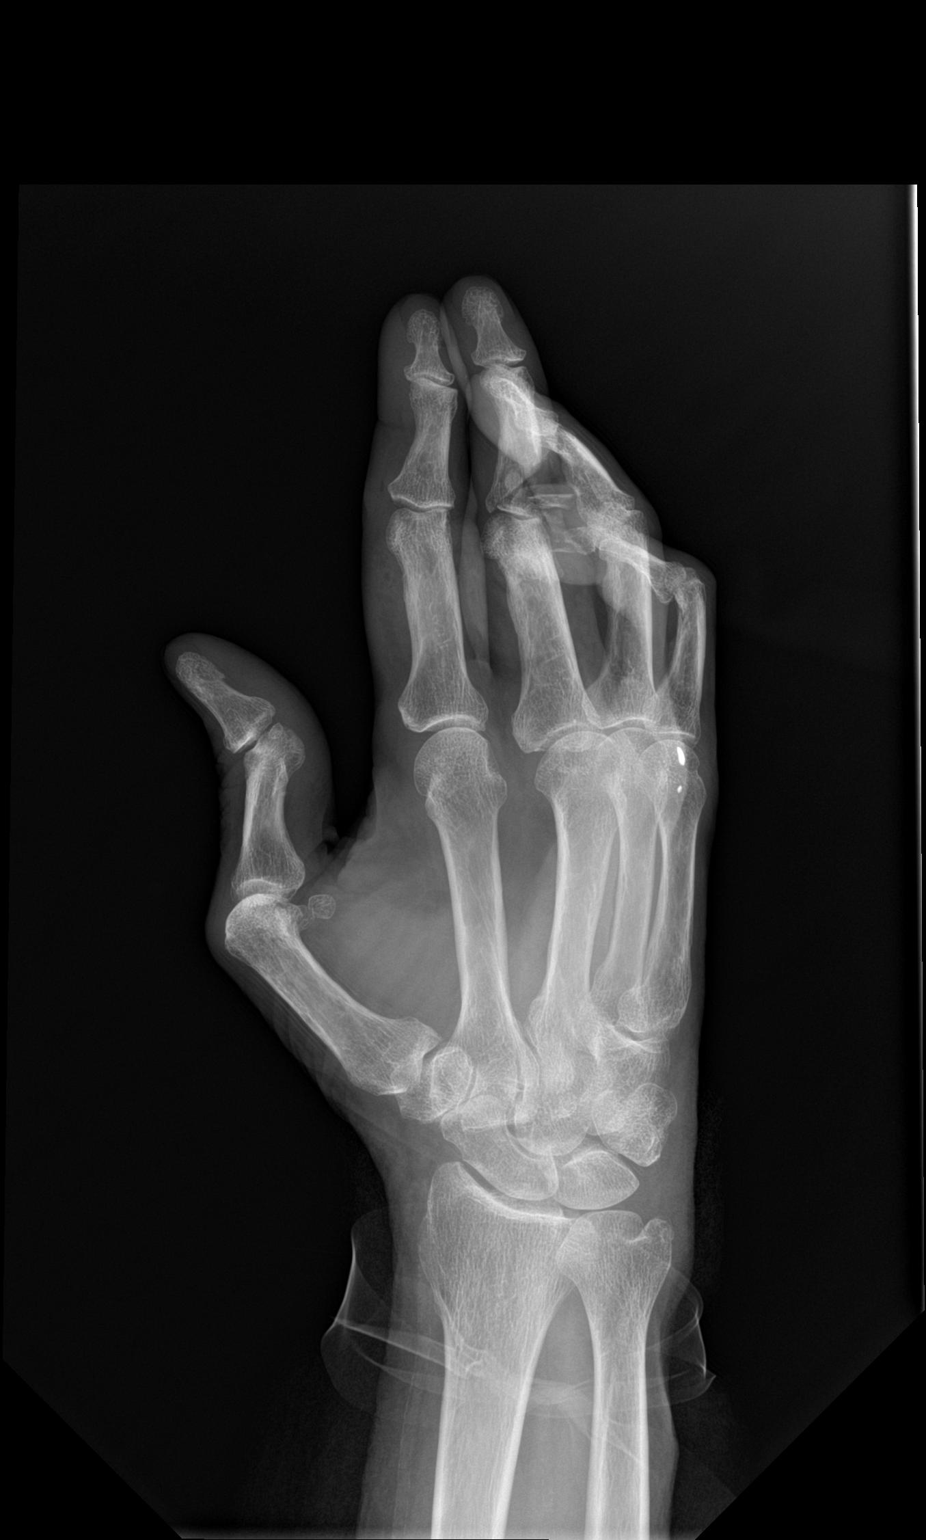

[hand lat]
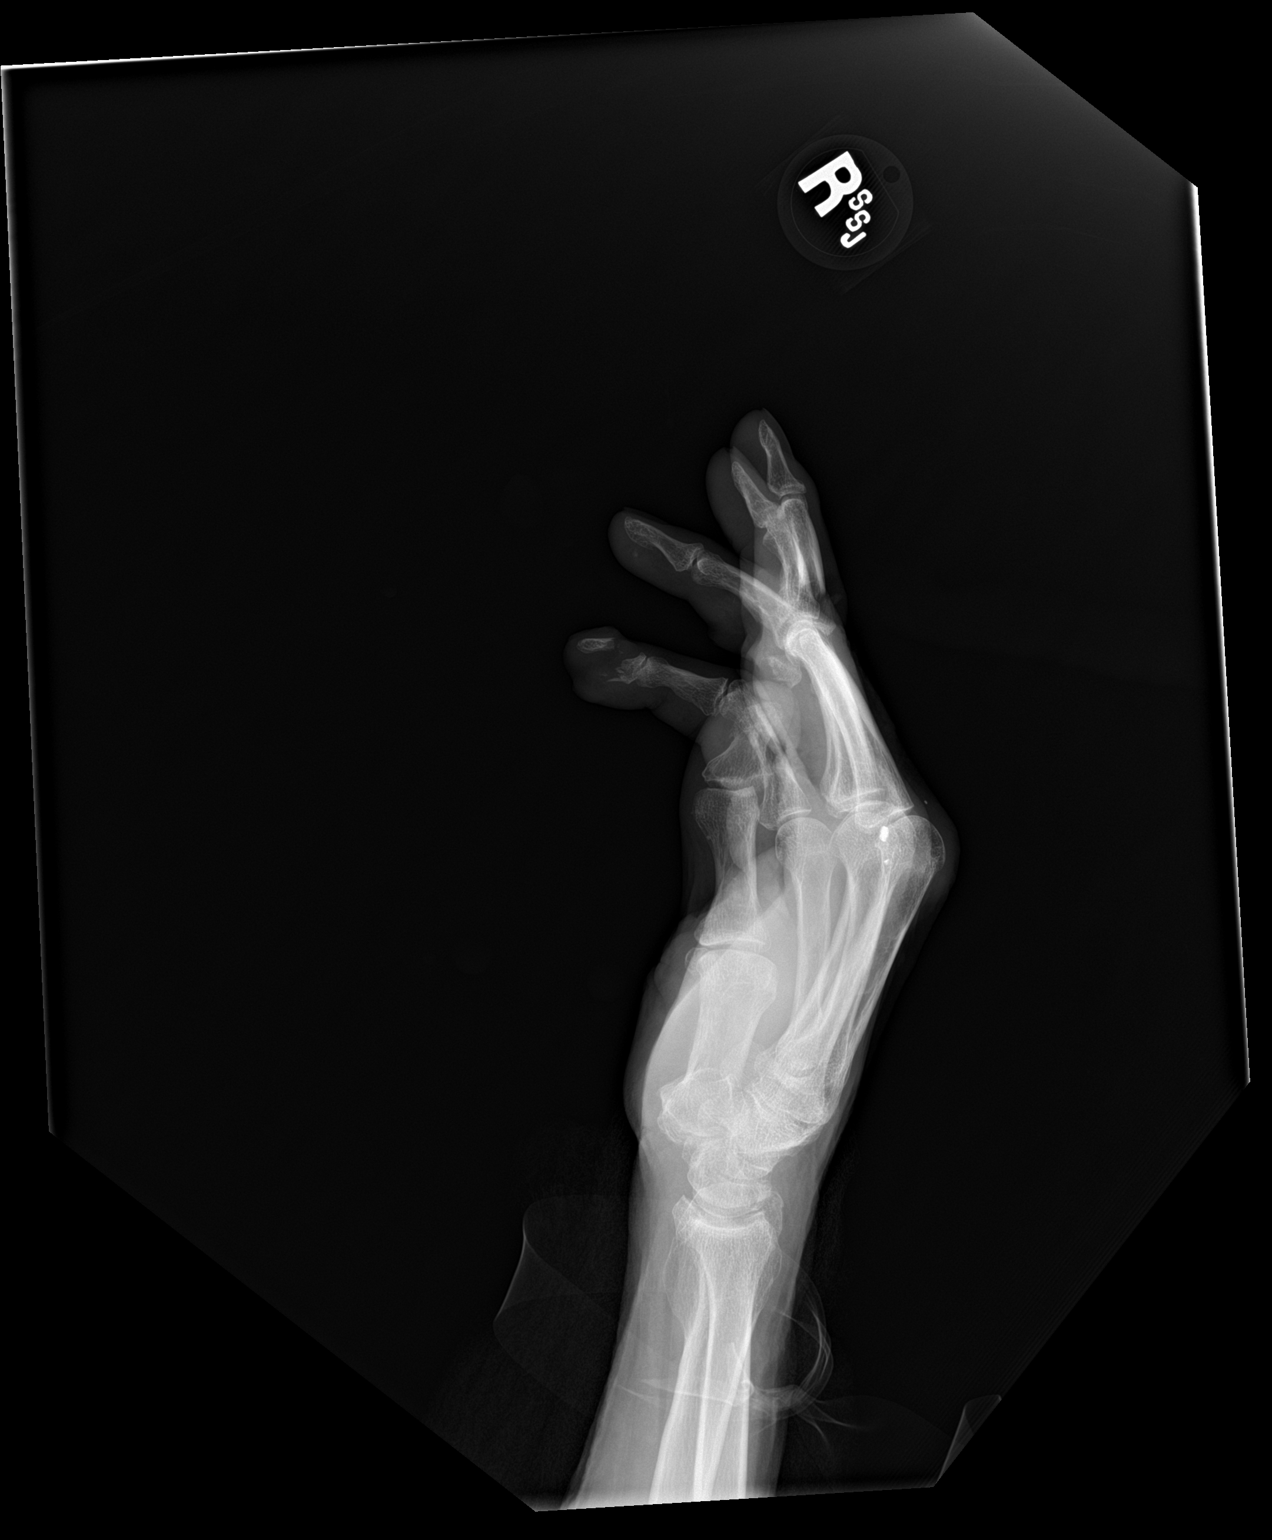

[3 of 3 positions shown; findings below may reference images not displayed]

FINDINGS: Soft tissue defect and acute fracture/bony defect of the middle
phalanx of the third digit of the right hand. The fracture
line/defect extends to the PIP. Displaced bony fragment within the
dorsal soft tissues of the third digit.

Acute soft tissue defect and bony abnormality of the distal phalanx
of the fifth digit with displacement of the distal fracture
fragment/defect.

Radiopaque foreign bodies within the soft tissues on the dorsal
aspect of the [DATE] metacarpal heads.

Degenerative changes of the interphalangeal joints. Degenerative
changes of the first interphalangeal joint and carpometacarpal
joint.
IMPRESSION: Acute penetrating injury with soft tissue defect and acute bony
defect involving the base of the middle phalanx of the third digit,
as well as the distal phalanx of the fifth digit, with displacement
of bone fragments.
